# Patient Record
Sex: Male | Born: 1980 | Race: White | Hispanic: No | Marital: Single | Smoking: Current some day smoker
Health system: Southern US, Community
[De-identification: ages and names within clinical notes are randomized; demographics above are authoritative.]

## PROBLEM LIST (undated history)

## (undated) DIAGNOSIS — K219 Gastro-esophageal reflux disease without esophagitis: Secondary | ICD-10-CM

## (undated) DIAGNOSIS — G4730 Sleep apnea, unspecified: Secondary | ICD-10-CM

## (undated) HISTORY — DX: Gastro-esophageal reflux disease without esophagitis: K21.9

## (undated) HISTORY — PX: OTHER SURGICAL HISTORY: SHX169

## (undated) HISTORY — DX: Sleep apnea, unspecified: G47.30

---
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
## 2004-06-27 ENCOUNTER — Emergency Department (HOSPITAL_COMMUNITY): Admission: EM | Admit: 2004-06-27 | Discharge: 2004-06-27 | Payer: Self-pay | Admitting: Emergency Medicine
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
## 2011-08-22 ENCOUNTER — Emergency Department (HOSPITAL_COMMUNITY)
Admission: EM | Admit: 2011-08-22 | Discharge: 2011-08-22 | Disposition: A | Discharge status: Home / Self Care | Payer: Self-pay | Attending: Emergency Medicine | Admitting: Emergency Medicine

## 2011-08-22 ENCOUNTER — Encounter (HOSPITAL_COMMUNITY): Payer: Self-pay | Admitting: Emergency Medicine

## 2011-08-22 DIAGNOSIS — L0291 Cutaneous abscess, unspecified: Secondary | ICD-10-CM

## 2011-08-22 DIAGNOSIS — L02419 Cutaneous abscess of limb, unspecified: Secondary | ICD-10-CM | POA: Insufficient documentation

## 2011-08-22 MED ORDER — TRAMADOL HCL 50 MG PO TABS: 50.0000 mg | ORAL_TABLET | Freq: Four times a day (QID) | ORAL | Status: DC | PRN

## 2011-08-22 MED ORDER — SULFAMETHOXAZOLE-TMP DS 800-160 MG PO TABS
1.0000 | ORAL_TABLET | Freq: Once | ORAL | Status: AC
  Administered 2011-08-22: 1 via ORAL
  Filled 2011-08-22: qty 1

## 2011-08-22 MED ORDER — TRAMADOL HCL 50 MG PO TABS
100.0000 mg | ORAL_TABLET | Freq: Once | ORAL | Status: AC
  Administered 2011-08-22: 100 mg via ORAL
  Filled 2011-08-22: qty 2

## 2011-08-22 MED ORDER — LIDOCAINE HCL (PF) 1 % IJ SOLN
INTRAMUSCULAR | Status: AC
  Administered 2011-08-22: 21:00:00
  Filled 2011-08-22: qty 5

## 2011-08-22 MED ORDER — SULFAMETHOXAZOLE-TRIMETHOPRIM 800-160 MG PO TABS: 1.0000 | ORAL_TABLET | Freq: Two times a day (BID) | ORAL | Status: AC

## 2011-08-22 NOTE — ED Provider Notes (Signed)
History     CSN: [id number]  Arrival date & time 08/22/11  1910   First MD Initiated Contact with Patient 08/22/11 1941      Chief Complaint  Patient presents with  . Abscess    (Consider location/radiation/quality/duration/timing/severity/associated sxs/prior treatment) Patient is a 31 y.o. male presenting with abscess. The history is provided by the patient.  Abscess  This is a new problem. The current episode started less than one week ago. The onset was gradual. The problem occurs continuously. The problem has been gradually worsening. The abscess is present on the right upper leg. The problem is moderate. The abscess is characterized by painfulness, swelling and redness. Pertinent negatives include no fever, no vomiting and no sore throat. Associated symptoms comments: Patient also denies numbness or rash.  States that nothing makes the pain better or worse.Marland Kitchen His past medical history does not include skin abscesses in family. There were no sick contacts. He has received no recent medical care.    History reviewed. No pertinent past medical history.  History reviewed. No pertinent past surgical history.  History reviewed. No pertinent family history.  History  Substance Use Topics  . Smoking status: Never Smoker   . Smokeless tobacco: Not on file  . Alcohol Use: No      Review of Systems  Constitutional: Negative for fever and chills.  HENT: Negative for sore throat.   Cardiovascular: Negative for leg swelling.  Gastrointestinal: Negative for nausea and vomiting.  Musculoskeletal: Negative for joint swelling and arthralgias.  Skin: Positive for color change.       Abscess   Neurological: Negative for weakness and numbness.  Hematological: Negative for adenopathy.  All other systems reviewed and are negative.    Allergies  Review of patient's allergies indicates no known allergies.  Home Medications  No current outpatient prescriptions on file.  BP 131/74   Pulse 98  Temp 98.5 F (36.9 C) (Oral)  Resp 14  Ht 5\' 8"  (1.727 m)  Wt 221 lb (100.245 kg)  BMI 33.60 kg/m2  SpO2 99%  Physical Exam  Nursing note and vitals reviewed. Constitutional: He is oriented to person, place, and time. He appears well-developed and well-nourished. No distress.  HENT:  Head: Normocephalic and atraumatic.  Cardiovascular: Normal rate, regular rhythm and normal heart sounds.   Pulmonary/Chest: Effort normal and breath sounds normal.  Neurological: He is alert and oriented to person, place, and time. He exhibits normal muscle tone. Coordination normal.  Skin: Skin is warm. There is erythema.       Localized area of erythema and induration to the lateral right thigh.  Mild fluctuance also present.  No red streaks.    ED Course  Procedures (including critical care time)  Labs Reviewed - No data to display      MDM   INCISION AND DRAINAGE Performed by: Pauline Aus L. Consent: Verbal consent obtained. Risks and benefits: risks, benefits and alternatives were discussed Type: abscess  Body area: right thigh Anesthesia: local infiltration  Local anesthetic: lidocaine 1% w/o epinephrine  Anesthetic total: 3 ml  Complexity: complex Blunt dissection to break up loculations  Drainage: purulent  Drainage amount: moderate Packing material: 1/4 in iodoform gauze  Patient tolerance: Patient tolerated the procedure well with no immediate complications.     Patient has abscess to right lateral thigh with 7 cm of surrounding erythema.  Pt is well appearing.  Vitals stable.  Agrees to return here in 2 days for recheck and packing removal.  Patient requests NOT to have narcotics   The patient appears reasonably screened and/or stabilized for discharge and I doubt any other medical condition or other [facility] requiring further screening, evaluation, or treatment in the ED at this time prior to discharge.   Prescribed: Ultram Bactrim DS    Artem Bunte L.  Adreyan Carbajal, [place] 08/24/11 2357

## 2011-08-22 NOTE — ED Notes (Signed)
Pt presents to ED with abscess on right thigh. Pt states he first noticed abscess 3 days ago. Redness noted around site.

## 2011-08-22 NOTE — ED Notes (Signed)
Patient complaining of abscess/possible spider bite to upper right leg. Complaining of swelling, redness, and pressure. States has gotten worse today.

## 2011-08-24 ENCOUNTER — Emergency Department (HOSPITAL_COMMUNITY)
Admission: EM | Admit: 2011-08-24 | Discharge: 2011-08-24 | Disposition: A | Discharge status: Home / Self Care | Payer: Self-pay | Attending: Emergency Medicine | Admitting: Emergency Medicine

## 2011-08-24 ENCOUNTER — Encounter (HOSPITAL_COMMUNITY): Payer: Self-pay | Admitting: *Deleted

## 2011-08-24 DIAGNOSIS — Z5189 Encounter for other specified aftercare: Secondary | ICD-10-CM

## 2011-08-24 DIAGNOSIS — L02419 Cutaneous abscess of limb, unspecified: Secondary | ICD-10-CM | POA: Insufficient documentation

## 2011-08-24 MED ORDER — OXYCODONE-ACETAMINOPHEN 5-325 MG PO TABS: 1.0000 | ORAL_TABLET | Freq: Four times a day (QID) | ORAL | Status: AC | PRN

## 2011-08-24 MED ORDER — OXYCODONE-ACETAMINOPHEN 5-325 MG PREPACK: 1.0000 | ORAL_TABLET | Freq: Four times a day (QID) | ORAL | Status: DC | PRN

## 2011-08-24 NOTE — ED Notes (Signed)
Here for recheck of abscess to rt thigh

## 2011-08-24 NOTE — ED Provider Notes (Signed)
History     CSN: [id number]  Arrival date & time 08/24/11  1909   First MD Initiated Contact with Patient 08/24/11 2116      Chief Complaint  Patient presents with  . Wound Check    (Consider location/radiation/quality/duration/timing/severity/associated sxs/prior treatment) HPI Pt reports he had abscess on R thigh drained 2 days ago. Swelling and redness has improved, but pain has been more severe than he anticipated when he refused pain medications during this last visit. No fevers.   History reviewed. No pertinent past medical history.  History reviewed. No pertinent past surgical history.  History reviewed. No pertinent family history.  History  Substance Use Topics  . Smoking status: Never Smoker   . Smokeless tobacco: Not on file  . Alcohol Use: No      Review of Systems No fever No drainage  Allergies  Review of patient's allergies indicates no known allergies.  Home Medications   Current Outpatient Rx  Name Route Sig Dispense Refill  . ACETAMINOPHEN 500 MG PO TABS Oral Take 1,000 mg by mouth 2 (two) times daily as needed.    . SULFAMETHOXAZOLE-TRIMETHOPRIM 800-160 MG PO TABS Oral Take 1 tablet by mouth 2 (two) times daily. 20 tablet 0    BP 146/61  Pulse 83  Temp 98.2 F (36.8 C) (Oral)  Resp 20  Ht 5\' 7"  (1.702 m)  Wt 221 lb (100.245 kg)  BMI 34.61 kg/m2  SpO2 98%  Physical Exam  Constitutional: He is oriented to person, place, and time. He appears well-developed and well-nourished.  HENT:  Head: Normocephalic and atraumatic.  Neck: Neck supple.  Pulmonary/Chest: Effort normal.  Neurological: He is alert and oriented to person, place, and time. No cranial nerve deficit.  Skin:       Well healing abscess to R thigh  Psychiatric: He has a normal mood and affect. His behavior is normal.    ED Course  Procedures (including critical care time)  Labs Reviewed - No data to display No results found.   No diagnosis found.    MDM    Healing Abscess, s/p I&D without signs of worsening infection.         Charles B. Bernette Mayers, MD 08/24/11 2121

## 2011-08-25 NOTE — ED Provider Notes (Signed)
Medical screening examination/treatment/procedure(s) were performed by non-physician practitioner and as supervising physician I was immediately available for consultation/collaboration.   Glynn Octave, MD 08/25/11 [phone]

## 2011-08-29 MED FILL — Oxycodone w/ Acetaminophen Tab 5-325 MG: ORAL | Qty: 6 | Status: AC

## 2011-10-13 ENCOUNTER — Encounter (HOSPITAL_COMMUNITY): Payer: Self-pay | Admitting: *Deleted

## 2011-10-13 ENCOUNTER — Emergency Department (HOSPITAL_COMMUNITY): Payer: Self-pay

## 2011-10-13 ENCOUNTER — Emergency Department (HOSPITAL_COMMUNITY)
Admission: EM | Admit: 2011-10-13 | Discharge: 2011-10-13 | Disposition: A | Discharge status: Home / Self Care | Payer: Self-pay | Attending: Emergency Medicine | Admitting: Emergency Medicine

## 2011-10-13 DIAGNOSIS — F17200 Nicotine dependence, unspecified, uncomplicated: Secondary | ICD-10-CM | POA: Insufficient documentation

## 2011-10-13 DIAGNOSIS — R0789 Other chest pain: Secondary | ICD-10-CM

## 2011-10-13 DIAGNOSIS — X58XXXA Exposure to other specified factors, initial encounter: Secondary | ICD-10-CM | POA: Insufficient documentation

## 2011-10-13 DIAGNOSIS — R079 Chest pain, unspecified: Secondary | ICD-10-CM | POA: Insufficient documentation

## 2011-10-13 DIAGNOSIS — IMO0002 Reserved for concepts with insufficient information to code with codable children: Secondary | ICD-10-CM | POA: Insufficient documentation

## 2011-10-13 DIAGNOSIS — N289 Disorder of kidney and ureter, unspecified: Secondary | ICD-10-CM

## 2011-10-13 LAB — POCT I-STAT, CHEM 8
Chloride: 106 mEq/L (ref 96–112)
Creatinine, Ser: 1.4 mg/dL — ABNORMAL HIGH (ref 0.50–1.35)
Glucose, Bld: 99 mg/dL (ref 70–99)
Potassium: 3.8 mEq/L (ref 3.5–5.1)

## 2011-10-13 MED ORDER — IBUPROFEN 800 MG PO TABS
800.0000 mg | ORAL_TABLET | Freq: Once | ORAL | Status: AC
  Administered 2011-10-13: 800 mg via ORAL
  Filled 2011-10-13: qty 1

## 2011-10-13 NOTE — ED Notes (Signed)
Pt alert & oriented x4, stable gait. Patient given discharge instructions, paperwork. Patient instructed to stop at the registration desk to finish any additional paperwork. Patient verbalized understanding. Pt left department w/ no further questions.  

## 2011-10-13 NOTE — ED Notes (Signed)
Chest pain last pm with pain in both arms,  Numb sensation. In arms and legs.intermittently.

## 2011-10-13 NOTE — ED Notes (Signed)
Pt reports chest pain the middle & right side. Denies any new activity or injury. Denies any N/V.

## 2011-10-13 NOTE — ED Provider Notes (Signed)
History     CSN: [id number]  Arrival date & time 10/13/11  4098   First MD Initiated Contact with Patient 10/13/11 1938      Chief Complaint  Patient presents with  . Chest Pain    (Consider location/radiation/quality/duration/timing/severity/associated sxs/prior treatment) HPI Patient with some tingling to right arm during night last night.  Today had intermittent right chest tightness all day which worsened with right arm movement.  He denies cough, congestion, fever, chills, leg swelling, history of pe, exertional pain, trauma, or numbness.  He has no family history of cad.  History reviewed. No pertinent past medical history.  History reviewed. No pertinent past surgical history.  History reviewed. No pertinent family history.  History  Substance Use Topics  . Smoking status: Current Some Day Smoker  . Smokeless tobacco: Not on file  . Alcohol Use: No      Review of Systems  Constitutional: Negative for fever and chills.  HENT: Negative for neck stiffness.   Eyes: Negative for visual disturbance.  Respiratory: Negative for shortness of breath.   Cardiovascular: Positive for chest pain. Negative for leg swelling.  Gastrointestinal: Negative for vomiting, diarrhea and blood in stool.  Genitourinary: Negative for dysuria, frequency and decreased urine volume.  Musculoskeletal: Negative for myalgias and joint swelling.  Skin: Negative for rash.  Neurological: Negative for weakness.  Hematological: Negative for adenopathy.  Psychiatric/Behavioral: Negative for agitation.    Allergies  Review of patient's allergies indicates no known allergies.  Home Medications   Current Outpatient Rx  Name Route Sig Dispense Refill  . ACETAMINOPHEN 500 MG PO TABS Oral Take 1,000 mg by mouth 2 (two) times daily as needed.      There were no vitals taken for this visit.  Physical Exam  Vitals reviewed. Constitutional: He is oriented to person, place, and time. He appears  well-developed and well-nourished.  HENT:  Head: Normocephalic and atraumatic.  Right Ear: External ear normal.  Left Ear: External ear normal.  Nose: Nose normal.  Mouth/Throat: Oropharynx is clear and moist.  Eyes: Conjunctivae normal and EOM are normal. Pupils are equal, round, and reactive to light.  Neck: Normal range of motion. Neck supple.  Cardiovascular: Normal rate, regular rhythm, normal heart sounds and intact distal pulses.   Pulmonary/Chest: Effort normal and breath sounds normal. No respiratory distress. He has no wheezes. He exhibits no tenderness.       Chest ttp over right costochondral junction reproducing pain.   Abdominal: Soft. Bowel sounds are normal. He exhibits no distension and no mass. There is no tenderness. There is no guarding.  Musculoskeletal: Normal range of motion.       Abrasion palm left hand  Neurological: He is alert and oriented to person, place, and time. He has normal reflexes. He exhibits normal muscle tone. Coordination normal.  Skin: Skin is warm and dry.  Psychiatric: He has a normal mood and affect. His behavior is normal. Judgment and thought content normal.    ED Course  Procedures (including critical care time)  Labs Reviewed - No data to display No results found.   No diagnosis found.   Date: 10/13/2011  Rate: 86  Rhythm: normal sinus rhythm  QRS Axis: normal  Intervals: normal  ST/T Wave abnormalities: normal  Conduction Disutrbances: none  Narrative Interpretation: unremarkable      MDM      Chest wall pain- normal ekg, chest pain reproducible, cxr clear.  Patient advised  Renal insufficiency.  Creatinine here 1.4.  Patient advised not to use further nsaids and to use tylenol and recheck next week.     Hilario Quarry, MD 10/13/11 2049

## 2012-12-02 IMAGING — CR DG CHEST 2V
2 series · 2 of 2 positions shown · non-contrast
Comparison: None.

CLINICAL DATA: Chest pain.

CHEST - 2 VIEW

[view not recorded (1 of 2)]
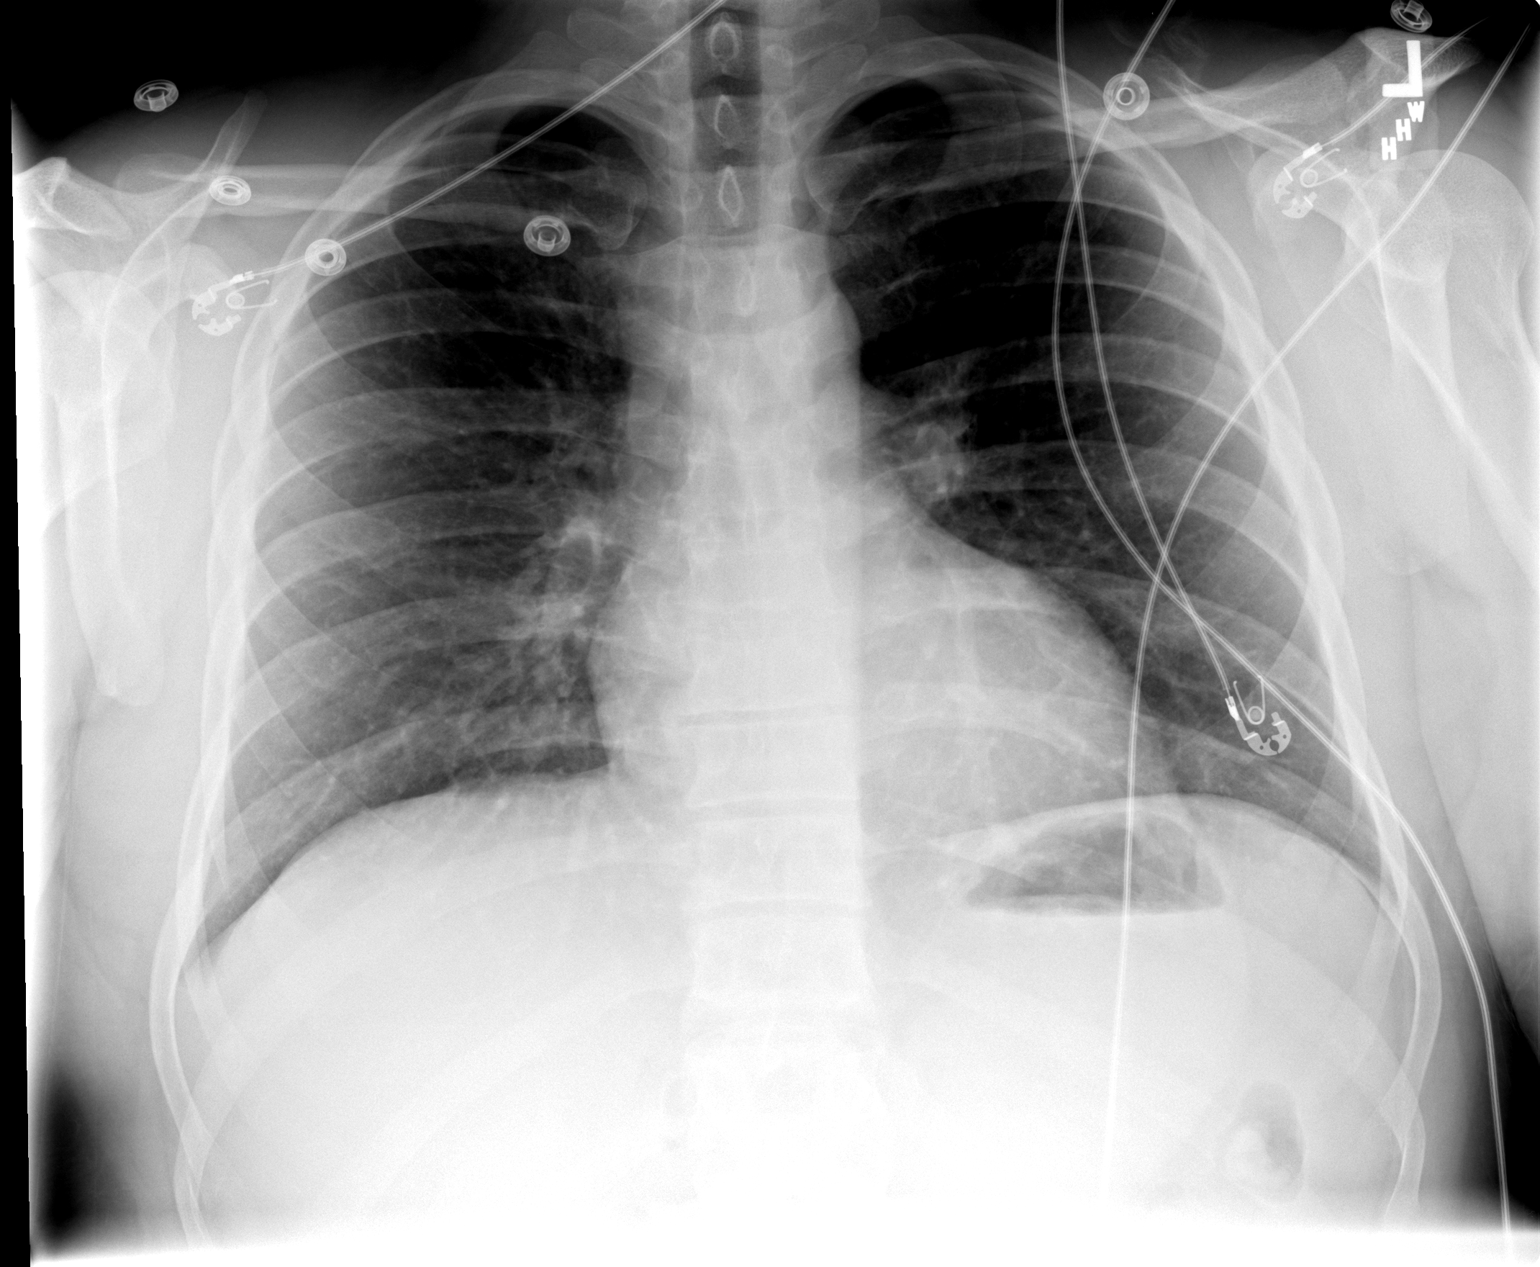

[view not recorded (2 of 2)]
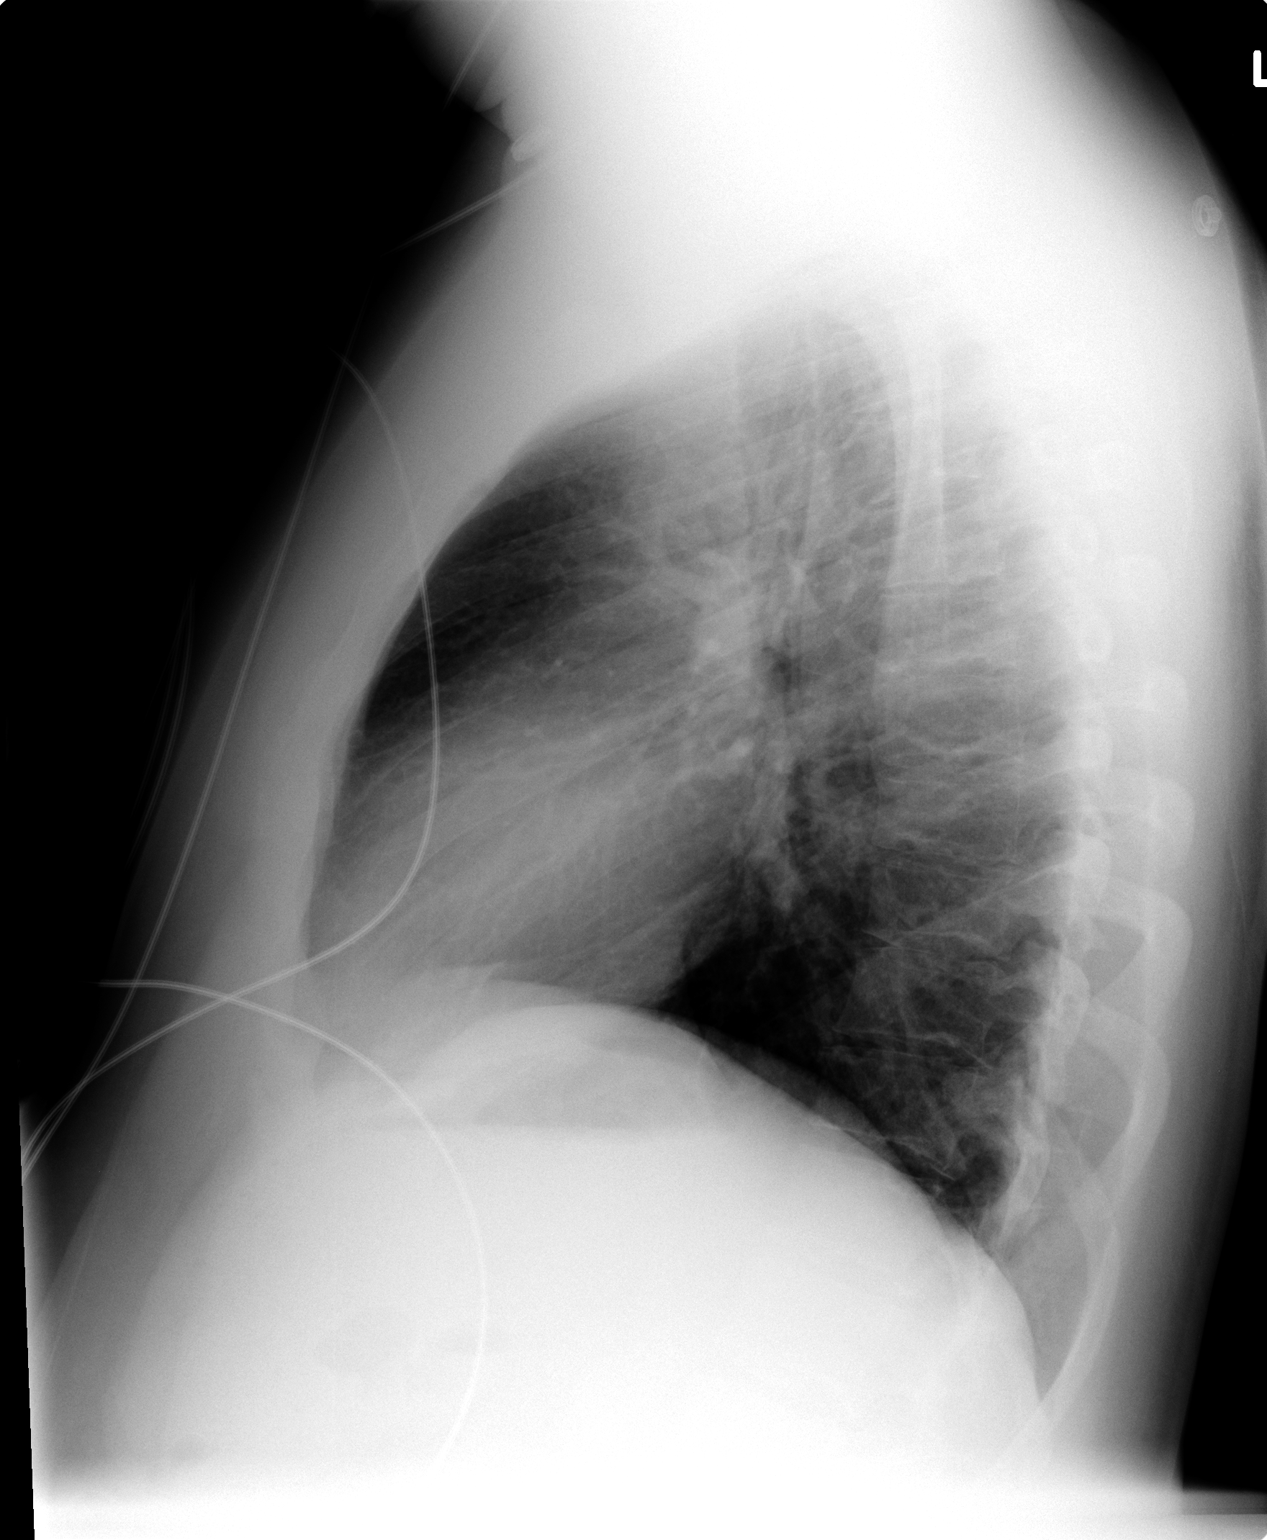

[2 of 2 positions shown; findings below may reference images not displayed]

FINDINGS: Trachea is midline.  Heart size normal.  Lungs are clear.
No pleural fluid.
IMPRESSION: No acute findings.

## 2017-08-27 ENCOUNTER — Encounter: Payer: Self-pay | Admitting: Internal Medicine

## 2017-09-14 ENCOUNTER — Telehealth: Payer: Self-pay | Admitting: Internal Medicine

## 2017-09-14 NOTE — Telephone Encounter (Signed)
Called and spoke with Roxine CaddyAmy Boyd, PA. [facility]. Checked H.pylori serology and positive per Roxine CaddyAmy Boyd, PA, tried treatment but not tolerated and changed regimens. (I am not entirely clear the whole regimen), regardless, this has been completed.   Yesterday with recurrent epigastric pain, burning, vomiting. Had episode of hematemesis. Still with a lot of abdominal pain. Diffuse tenderness but no rebound or guarding. Continues with Carafate, Protonix BID. In office currently.   I had recommended if severe abdominal pain to present to ED. In interim, would recommend checking stat CT abd/pelvis today, repeat labs. Recommended he go to ED if worsening pain over weekend. Will likely need EGD vs other route if CT dictates otherwise. Need to rule out complicated process, which [facility] is taking care of today.   Opening on 9/5 at 9am. Roxine CaddyAmy Boyd, PA informed and will tell patient.    Darl PikesSusan: can we have all records [facility] has (from [facility], labs, etc), sent to us so this can be available for appt?

## 2017-09-14 NOTE — Telephone Encounter (Signed)
Requested records.

## 2017-09-20 ENCOUNTER — Encounter

## 2017-09-20 ENCOUNTER — Ambulatory Visit: Payer: Self-pay | Admitting: Gastroenterology

## 2017-10-01 ENCOUNTER — Telehealth: Payer: Self-pay

## 2017-10-01 ENCOUNTER — Encounter: Payer: Self-pay | Admitting: Nurse Practitioner

## 2017-10-01 ENCOUNTER — Ambulatory Visit: Payer: Medicaid Other | Admitting: Nurse Practitioner

## 2017-10-01 ENCOUNTER — Other Ambulatory Visit: Payer: Self-pay

## 2017-10-01 DIAGNOSIS — K219 Gastro-esophageal reflux disease without esophagitis: Secondary | ICD-10-CM

## 2017-10-01 DIAGNOSIS — R109 Unspecified abdominal pain: Secondary | ICD-10-CM | POA: Insufficient documentation

## 2017-10-01 DIAGNOSIS — R112 Nausea with vomiting, unspecified: Secondary | ICD-10-CM

## 2017-10-01 DIAGNOSIS — R768 Other specified abnormal immunological findings in serum: Secondary | ICD-10-CM | POA: Diagnosis not present

## 2017-10-01 DIAGNOSIS — R1013 Epigastric pain: Secondary | ICD-10-CM | POA: Diagnosis not present

## 2017-10-01 MED ORDER — SUCRALFATE 1 GM/10ML PO SUSP: 1.0000 g | Freq: Four times a day (QID) | ORAL | 2 refills | Status: AC | PRN

## 2017-10-01 NOTE — Assessment & Plan Note (Signed)
The patient had a positive H. pylori serologies in the emergency department.  Unclear if this is representing an acute infection or a previously resolved infection.  He was treated with appropriate 4-agent regimen which she completed.  He will need testing for eradication.  This will likely be done by endoscopy based on recommendations above.  If he is not eradicated and then he will need further treatment.  Follow-up in 3 months.

## 2017-10-01 NOTE — Patient Instructions (Addendum)
1. Stop taking Protonix for now. 2. Start taking Dexilant 60 mg.  I am providing samples for 1 to 2 weeks.  Call us in 1 to 2 weeks and let us know if it is helping. 3. Start taking Carafate up to 4 times a day, as needed for breakthrough heartburn/stomach pain. 4. We will schedule your upper endoscopy for you. 5. Further recommendations will be made after colonoscopy. 6. Have your blood test drawn when you are able to. 7. Return for follow-up in 3 months. 8. Call us if you have any questions or concerns.  At [facility] we value your feedback. You may receive a survey about your visit today. Please share your experience as we strive to create trusting relationships with our patients to provide genuine, compassionate, quality care.  We appreciate your understanding and patience as we review any laboratory studies, imaging, and other diagnostic tests that are ordered as we care for you. Our office policy is 5 business days for review of these results, and any emergent or urgent results are addressed in a timely manner for your best interest. If you do not hear from our office in 1 week, please contact us.   We also encourage the use of MyChart, which contains your medical information for your review as well. If you are not enrolled in this feature, an access code is on this after visit summary for your convenience. Thank you for allowing [place] to be involved in your care.  It was great to meet you today!  I hope you have a great summer!!     Food Choices for Gastroesophageal Reflux Disease, Adult When you have gastroesophageal reflux disease (GERD), the foods you eat and your eating habits are very important. Choosing the right foods can help ease the discomfort of GERD. Consider working with a diet and nutrition specialist (dietitian) to help you make healthy food choices. What general guidelines should I follow? Eating plan  Choose healthy foods low in fat, such as fruits,  vegetables, whole grains, low-fat dairy products, and lean meat, fish, and poultry.  Eat frequent, small meals instead of three large meals each day. Eat your meals slowly, in a relaxed setting. Avoid bending over or lying down until 2-3 hours after eating.  Limit high-fat foods such as fatty meats or fried foods.  Limit your intake of oils, butter, and shortening to less than 8 teaspoons each day.  Avoid the following: ? Foods that cause symptoms. These may be different for different people. Keep a food diary to keep track of foods that cause symptoms. ? Alcohol. ? Drinking large amounts of liquid with meals. ? Eating meals during the 2-3 hours before bed.  Cook foods using methods other than frying. This may include baking, grilling, or broiling. Lifestyle   Maintain a healthy weight. Ask your health care provider what weight is healthy for you. If you need to lose weight, work with your health care provider to do so safely.  Exercise for at least 30 minutes on 5 or more days each week, or as told by your health care provider.  Avoid wearing clothes that fit tightly around your waist and chest.  Do not use any products that contain nicotine or tobacco, such as cigarettes and e-cigarettes. If you need help quitting, ask your health care provider.  Sleep with the head of your bed raised. Use a wedge under the mattress or blocks under the bed frame to raise the head of the bed. What  foods are not recommended? The items listed may not be a complete list. Talk with your dietitian about what dietary choices are best for you. Grains Pastries or quick breads with added fat. [place] toast. Vegetables Deep fried vegetables. [place] fries. Any vegetables prepared with added fat. Any vegetables that cause symptoms. For some people this may include tomatoes and tomato products, chili peppers, onions and garlic, and horseradish. Fruits Any fruits prepared with added fat. Any fruits that cause  symptoms. For some people this may include citrus fruits, such as oranges, grapefruit, pineapple, and lemons. Meats and other protein foods High-fat meats, such as fatty beef or pork, hot dogs, ribs, ham, sausage, salami and bacon. Fried meat or protein, including fried fish and fried chicken. Nuts and nut butters. Dairy Whole milk and chocolate milk. Sour cream. Cream. Ice cream. Cream cheese. Milk shakes. Beverages Coffee and tea, with or without caffeine. Carbonated beverages. Sodas. Energy drinks. Fruit juice made with acidic fruits (such as orange or grapefruit). Tomato juice. Alcoholic drinks. Fats and oils Butter. Margarine. Shortening. Ghee. Sweets and desserts Chocolate and cocoa. Donuts. Seasoning and other foods Pepper. Peppermint and spearmint. Any condiments, herbs, or seasonings that cause symptoms. For some people, this may include curry, hot sauce, or vinegar-based salad dressings. Summary  When you have gastroesophageal reflux disease (GERD), food and lifestyle choices are very important to help ease the discomfort of GERD.  Eat frequent, small meals instead of three large meals each day. Eat your meals slowly, in a relaxed setting. Avoid bending over or lying down until 2-3 hours after eating.  Limit high-fat foods such as fatty meat or fried foods. This information is not intended to replace advice given to you by your health care provider. Make sure you discuss any questions you have with your health care provider. Document Released: 01/02/2005 Document Revised: 01/04/2016 Document Reviewed: 01/04/2016 Elsevier Interactive Patient Education  [employer]2018 [employer]

## 2017-10-01 NOTE — Assessment & Plan Note (Signed)
Acute onset abdominal pain which is not been relieved by H. pylori treatment and twice daily Protonix.  His pain is constant, epigastric, burning and associated with nausea/vomiting.  I will change him to Dexilant daily and Carafate 4 times a day.  EGD as per above.  He did have a CT scan completed by his primary care.  We will request these results.  I will check CBC today due to complaint of hematemesis intermittently for any change in hemoglobin.  Follow-up in 3 months.

## 2017-10-01 NOTE — Progress Notes (Signed)
Primary Care Physician:  Lovey NewcomerBoyd, William S, PA Primary Gastroenterologist:  Dr. Jena Gaussourk  Chief Complaint  Patient presents with  . Abdominal Pain    pain comes/goes  . h pylori  . Nausea    w/ vomiting  . Diarrhea    occas    HPI:   Kent Collins is a 37 y.o. male who presents on referral from primary care for abdominal pain, GERD, H. pylori positive serologies.  Appears onset 09/14/2017 a phone consult was provided (see note for further details).  Patient is a positive for H. pylori on serology at [facility].  Completed some form of treatment.  Recurrent issues including epigastric pain, burning, vomiting with episode of hematemesis and a lot of diffuse abdominal pain.  Still on Carafate and Protonix twice daily.  Recommended presentation to the ED if severe symptoms or unresolving symptoms.  In the interim, stat CT of the abdomen and pelvis and repeat labs.  Indicated would likely need EGD depending on CT findings.  Patient was initially scheduled for 09/20/2017 but this was canceled and he was scheduled for this morning.  Cards are requested.  Mention provided with the referral including office visit note dated 08/23/2017 which was in the emergency department follow-up for abdominal pain, heartburn, vomiting.  He was found to be H. pylori positive in the emergency department and was treated with Amoxil, Biaxin, Protonix.  Continued heartburn and abdominal discomfort although minimally improved, difficulty eating, lots of heartburn and reflux with meals and at night.  GERD and abdominal pain symptoms were acute onset.  He was provided with Carafate.  He was having difficulty tolerating his Biaxin regimen and was started on tetracycline, Flagyl, Pepto-Bismol, PPI as a substitute regimen.  Referred to GI.  The labs provided which were drawn 08/20/2017 presumably in the emergency department which found elevated white cell count at 13.7, otherwise essentially normal.  CMP also essentially  normal.  His H. pylori testing was serology and found to be positive for antigen.  Abdominal/pelvic CT results that were recommended is not available to me at this time.  He is accompanied by his fiancee today. Today he states he did have [facility] 2 weeks ago, hasn't received results. He completed his second regimen about 1 week ago. He is still on Protonix twice a day. He has chronic history of GERD and has done well on Zantac most of his life. His new/acute onset abdominal pain was not relieved with Zantac. He is still having abdominal pain, no better. Abdominal pain is constant, burning, epigastric and radiates downward. No worse after eating. "Sometimes I can eat something that will irritate it." has lost 18 lbs in 2 weeks with cutting out sweets, bread, sodas, sweet tea. Has taken Ibuprofen recently which caused a flare of his symptoms. Denies hematochezia, melena. Is nauseated all the time, last emesis last night. Ty[pically vomits 4 times a week. Had hematemesis last night "more than a trace" and described as red blood, no coffee grounds. Does have typical GERD triggers (spicy foods, etc). Denies fever, chills. Denies chest pain, dyspnea, dizziness, lightheadedness, syncope, near syncope. Denies any other upper or lower GI symptoms.  Past Medical History:  Diagnosis Date  . GERD (gastroesophageal reflux disease)   . Sleep apnea    awaiting on CPAP as of 10/01/17    Past Surgical History:  Procedure Laterality Date  . NONE TO DATE     As of 10/01/17    Current  Outpatient Medications  Medication Sig Dispense Refill  . acetaminophen (TYLENOL) 500 MG tablet Take 1,000 mg by mouth every 6 (six) hours as needed.     . pantoprazole (PROTONIX) 40 MG tablet Take 1 tablet by mouth 2 (two) times daily.  1   No current facility-administered medications for this visit.     Allergies as of 10/01/2017  . (No Known Allergies)    Family History  Problem Relation Age of  Onset  . Ulcers Mother   . Ulcers Maternal Uncle   . Colon cancer Neg Hx   . Gastric cancer Neg Hx   . Esophageal cancer Neg Hx     Social History   Socioeconomic History  . Marital status: Single    Spouse name: Not on file  . Number of children: Not on file  . Years of education: Not on file  . Highest education level: Not on file  Occupational History  . Not on file  Social Needs  . Financial resource strain: Not on file  . Food insecurity:    Worry: Not on file    Inability: Not on file  . Transportation needs:    Medical: Not on file    Non-medical: Not on file  Tobacco Use  . Smoking status: Current Some Day Smoker    Types: Cigarettes  . Smokeless tobacco: Never Used  . Tobacco comment: Typically smokes 5 cigarettes a week.  Substance and Sexual Activity  . Alcohol use: No  . Drug use: No  . Sexual activity: Not on file  Lifestyle  . Physical activity:    Days per week: Not on file    Minutes per session: Not on file  . Stress: Not on file  Relationships  . Social connections:    Talks on phone: Not on file    Gets together: Not on file    Attends religious service: Not on file    Active member of club or organization: Not on file    Attends meetings of clubs or organizations: Not on file    Relationship status: Not on file  . Intimate partner violence:    Fear of current or ex partner: Not on file    Emotionally abused: Not on file    Physically abused: Not on file    Forced sexual activity: Not on file  Other Topics Concern  . Not on file  Social History Narrative  . Not on file    Review of Systems: General: Negative for anorexia, weight loss, fever, chills, fatigue, weakness. ENT: Negative for hoarseness, difficulty swallowing. CV: Negative for chest pain, angina, palpitations, peripheral edema.  Respiratory: Negative for dyspnea at rest, cough, sputum, wheezing.  GI: See history of present illness. MS: Negative for joint pain, low back  pain.  Derm: Negative for rash or itching.  Endo: Negative for unusual weight change.  Heme: Negative for bruising or bleeding. Allergy: Negative for rash or hives.    Physical Exam: BP 136/79   Pulse 71   Temp (!) 97 F (36.1 C) (Oral)   Ht 5\' 6"  (1.676 m)   Wt (!) 355 lb (161 kg)   BMI 57.30 kg/m  General:   Morbidly obese male. Alert and oriented. Pleasant and cooperative. Well-nourished and well-developed.  Head:  Normocephalic and atraumatic. Eyes:  Without icterus, sclera clear and conjunctiva pink.  Ears:  Normal auditory acuity. Cardiovascular:  S1, S2 present without murmurs appreciated. Extremities without clubbing or edema. Respiratory:  Clear to auscultation  bilaterally. No wheezes, rales, or rhonchi. No distress.  Gastrointestinal:  +BS, obese, soft, and non-distended. Moderate TTP epigastric region. No HSM noted. No guarding or rebound. No masses appreciated.  Rectal:  Deferred  Musculoskalatal:  Symmetrical without gross deformities. Neurologic:  Alert and oriented x4;  grossly normal neurologically. Psych:  Alert and cooperative. Normal mood and affect. Heme/Lymph/Immune: No excessive bruising noted.    10/01/2017 10:39 AM   Disclaimer: This note was dictated with voice recognition software. Similar sounding words can inadvertently be transcribed and may not be corrected upon review.

## 2017-10-01 NOTE — Assessment & Plan Note (Signed)
Patient notes a chronic history of GERD which is typically responded well to Zantac.  When he started having his acute onset abdominal pain Zantac did not help.  He was seen in the emergency department where he tested positive for H. pylori and was treated with a 4 agent regimen, which he completed.  Abdominal pain persists.  It is constant, burning, nausea.  He does have some hematemesis with vomiting.  I will check a CBC for acute blood loss, although I doubt this.  I will change his Protonix to Dexilant and provide samples for 1 to 2 weeks and request a progress report in 1 to 2 weeks.  I will start him on Carafate 4 times daily as well.  This is for symptomatic management until we can accomplish an upper endoscopy.  Further recommendations to follow.  Follow-up in 3 months in our office.  Proceed with EGD on propofol/MAC with Dr. Gala Romney in near future: the risks, benefits, and alternatives have been discussed with the patient in detail. The patient states understanding and desires to proceed.  The patient is not on any anticoagulants, anxiolytics, chronic pain medications, or antidepressants.  He is morbidly obese.  We will plan for the procedure on propofol/MAC to promote adequate sedation.

## 2017-10-01 NOTE — Progress Notes (Signed)
cc'd to pcp 

## 2017-10-01 NOTE — Assessment & Plan Note (Signed)
The patient does have nausea and vomiting about 4 times a week.  He does occasionally have hematemesis described as red blood, no coffee grounds.  Last episode of hematemesis was last night and "not a lot, but more than just trace."  We will change his PPI and add Carafate for symptomatic management.  Upper endoscopy will be accomplished for further evaluation.  CT results will be requested.  Labs as per below.  Follow-up in 3 months.

## 2017-10-01 NOTE — Telephone Encounter (Signed)
Called and informed pt of pre-op appt 10/24/17 at 12:45pm. Letter mailed.

## 2017-10-17 ENCOUNTER — Telehealth: Payer: Self-pay | Admitting: Gastroenterology

## 2017-10-17 DIAGNOSIS — K219 Gastro-esophageal reflux disease without esophagitis: Secondary | ICD-10-CM

## 2017-10-17 DIAGNOSIS — R1013 Epigastric pain: Secondary | ICD-10-CM

## 2017-10-17 DIAGNOSIS — R112 Nausea with vomiting, unspecified: Secondary | ICD-10-CM

## 2017-10-17 MED ORDER — DEXLANSOPRAZOLE 60 MG PO CPDR: 60.0000 mg | DELAYED_RELEASE_CAPSULE | Freq: Every day | ORAL | 3 refills | Status: DC

## 2017-10-17 NOTE — Addendum Note (Signed)
Addended by: Delane Ginger, Dayshawn Irizarry A on: 10/17/2017 04:57 PM   Modules accepted: Orders

## 2017-10-17 NOTE — Telephone Encounter (Signed)
Forwarding to [employer], NP, who gave pt samples of Dexilant on 10/01/2017. Please send in Rx.

## 2017-10-17 NOTE — Telephone Encounter (Signed)
Please notify patient, Rx sent per request.

## 2017-10-17 NOTE — Telephone Encounter (Signed)
Pt said he was given samples and doesn't remember the name of them. He said they worked and needs a prescription called into [facility].

## 2017-10-18 NOTE — Telephone Encounter (Signed)
Tried to call and VM not set up.  

## 2017-10-19 NOTE — Telephone Encounter (Signed)
Tried to call and VM not set up.  

## 2017-10-22 NOTE — Patient Instructions (Signed)
20    Your procedure is scheduled on:10/29/2017   Report to [facility] at   700  AM.  Call this number if you have problems the morning of surgery: [phone]   Remember:   Do not drink or eat food:After Midnight.    Clear liquids include soda, tea, black coffee, apple or grape juice, broth.  Take these medicines the morning of surgery with A SIP OF WATER: Dexilant    Do not wear jewelry, make-up or nail polish.  Do not wear lotions, powders, or perfumes. You may wear deodorant.  Do not shave 48 hours prior to surgery. Men may shave face and neck.  Do not bring valuables to the hospital.  Contacts, dentures or bridgework may not be worn into surgery.  Leave suitcase in the car. After surgery it may be brought to your room.  For patients admitted to the hospital, checkout time is 11:00 AM the day of discharge.   Patients discharged the day of surgery will not be allowed to drive home.  Name and phone number of your driver:    Please read over the following fact sheets that you were given: Pain Booklet, Lab Information and Anesthesia Post-op Instructions   Endoscopy Care After Please read the instructions outlined below and refer to this sheet in the next few weeks. These discharge instructions provide you with general information on caring for yourself after you leave the hospital. Your doctor may also give you specific instructions. While your treatment has been planned according to the most current medical practices available, unavoidable complications occasionally occur. If you have any problems or questions after discharge, please call your doctor. HOME CARE INSTRUCTIONS Activity  You may resume your regular activity but move at a slower pace for the next 24 hours.   Take frequent rest periods for the next 24 hours.   Walking will help expel (get rid of) the air and reduce the bloated feeling in your abdomen.   No driving for 24 hours (because of the anesthesia (medicine) used  during the test).   You may shower.   Do not sign any important legal documents or operate any machinery for 24 hours (because of the anesthesia used during the test).  Nutrition  Drink plenty of fluids.   You may resume your normal diet.   Begin with a light meal and progress to your normal diet.   Avoid alcoholic beverages for 24 hours or as instructed by your caregiver.  Medications You may resume your normal medications unless your caregiver tells you otherwise. What you can expect today  You may experience abdominal discomfort such as a feeling of fullness or "gas" pains.   You may experience a sore throat for 2 to 3 days. This is normal. Gargling with salt water may help this.  Follow-up Your doctor will discuss the results of your test with you. SEEK IMMEDIATE MEDICAL CARE IF:  You have excessive nausea (feeling sick to your stomach) and/or vomiting.   You have severe abdominal pain and distention (swelling).   You have trouble swallowing.   You have a temperature over 100 F (37.8 C).   You have rectal bleeding or vomiting of blood.  Document Released: 08/17/2003 Document Revised: 12/22/2010 Document Reviewed: 02/27/2007

## 2017-10-24 ENCOUNTER — Telehealth: Payer: Self-pay

## 2017-10-24 ENCOUNTER — Encounter (HOSPITAL_COMMUNITY)
Admission: RE | Admit: 2017-10-24 | Discharge: 2017-10-24 | Disposition: A | Discharge status: Home / Self Care | Payer: Medicaid Other | Source: Ambulatory Visit | Attending: Internal Medicine | Admitting: Internal Medicine

## 2017-10-24 ENCOUNTER — Encounter (HOSPITAL_COMMUNITY): Payer: Self-pay

## 2017-10-24 NOTE — Telephone Encounter (Signed)
Received VM for Kent Collins at [facility] endo, pt was no show for pre-op appt. They tried to call pt but no VM was set-up. I tried to call pt, spoke to [place]. Gave her phone number for endo scheduler. Advised her he would need to have pre-op appt prior to having procedure.

## 2017-10-26 NOTE — Telephone Encounter (Signed)
Spoke to endo scheduler, pt hasn't called to reschedule pre-op appt. EGD for 10/29/17 will be cancelled since he had no pre-op appt. Tried to call pt, no answer, unable to leave VM d/t no VM box is set-up.

## 2017-10-29 ENCOUNTER — Encounter (HOSPITAL_COMMUNITY): Admission: RE | Payer: Self-pay | Source: Ambulatory Visit

## 2017-10-29 ENCOUNTER — Ambulatory Visit (HOSPITAL_COMMUNITY): Admission: RE | Admit: 2017-10-29 | Payer: Medicaid Other | Source: Ambulatory Visit | Admitting: Internal Medicine

## 2017-10-29 SURGERY — ESOPHAGOGASTRODUODENOSCOPY (EGD) WITH PROPOFOL
Anesthesia: Monitor Anesthesia Care

## 2017-12-04 ENCOUNTER — Ambulatory Visit: Payer: Self-pay | Admitting: Nurse Practitioner

## 2018-01-01 ENCOUNTER — Ambulatory Visit: Payer: Medicaid Other | Admitting: Nurse Practitioner

## 2018-01-01 ENCOUNTER — Telehealth: Payer: Self-pay | Admitting: Internal Medicine

## 2018-01-01 ENCOUNTER — Encounter: Payer: Self-pay | Admitting: Internal Medicine

## 2018-01-01 NOTE — Telephone Encounter (Signed)
Patient was a no show and letter sent  °

## 2018-01-01 NOTE — Progress Notes (Deleted)
Referring Provider: Lavella Lemons, PA Primary Care Physician:  Lavella Lemons, PA Primary GI:  Dr. Gala Romney  No chief complaint on file.   HPI:   Kent Collins is a 37 y.o. male who presents for follow-up on GERD and abdominal pain.  The patient was last seen in our office 10/01/2017 for GERD, epigastric pain, positive H. pylori serology, nausea and vomiting.  Positive serologies were at [facility] and completed some form of treatment.  At the time she is having epigastric pain, burning, vomiting with episode of hematemesis and a lot of diffuse abdominal pain.  She is still on Carafate and Protonix.  Stat CT query need for EGD depending on findings.  Reviewed ER records and was treated with Amoxil, Biaxin, Protonix.  The patient was having difficulty tolerating Biaxin and was switched to tetracycline, Flagyl, Pepto-Bismol, PPI as a substitute regimen and referred to GI.  At his last office visit he indicated he did not receive his CT results.  Completed his second regimen, still on Protonix twice daily.  Chronic history of GERD done well on Zantac most of his life.  He made life changes related to his diet and has lost 18 pounds in 2 weeks.  Recent ibuprofen caused a flare of his symptoms.  Significant nausea, emesis the night before.  Typically vomits 4 times a week.  Noted hematemesis the night before "more than a trace" described as red blood, no coffee grounds.  Typical GERD figures.  No other GI symptoms.  He was changed to Dexilant and Carafate 4 times a day.  Recommended EGD on propofol/MAC.  We will request CT results.  Recommended CBC given hematemesis.  Follow-up in 3 months.  It does not appear CBC was completed.  The patient was a no-show for preop appointment.  EGD was subsequently canceled.  Today he states   Past Medical History:  Diagnosis Date  . GERD (gastroesophageal reflux disease)   . Sleep apnea    awaiting on CPAP as of 10/01/17    Past Surgical History:    Procedure Laterality Date  . NONE TO DATE     As of 10/01/17    Current Outpatient Medications  Medication Sig Dispense Refill  . acetaminophen (TYLENOL) 500 MG tablet Take 1,000 mg by mouth every 6 (six) hours as needed for moderate pain.     Marland Kitchen dexlansoprazole (DEXILANT) 60 MG capsule Take 1 capsule (60 mg total) by mouth daily. 90 capsule 3  . sucralfate (CARAFATE) 1 GM/10ML suspension Take 10 mLs (1 g total) by mouth 4 (four) times daily as needed. (Patient not taking: Reported on 10/23/2017) 420 mL 2   No current facility-administered medications for this visit.     Allergies as of 01/01/2018  . (No Known Allergies)    Family History  Problem Relation Age of Onset  . Ulcers Mother   . Ulcers Maternal Uncle   . Colon cancer Neg Hx   . Gastric cancer Neg Hx   . Esophageal cancer Neg Hx     Social History   Socioeconomic History  . Marital status: Single    Spouse name: Not on file  . Number of children: Not on file  . Years of education: Not on file  . Highest education level: Not on file  Occupational History  . Not on file  Social Needs  . Financial resource strain: Not on file  . Food insecurity:    Worry: Not on file  Inability: Not on file  . Transportation needs:    Medical: Not on file    Non-medical: Not on file  Tobacco Use  . Smoking status: Current Some Day Smoker    Types: Cigarettes  . Smokeless tobacco: Never Used  . Tobacco comment: Typically smokes 5 cigarettes a week.  Substance and Sexual Activity  . Alcohol use: No  . Drug use: No  . Sexual activity: Not on file  Lifestyle  . Physical activity:    Days per week: Not on file    Minutes per session: Not on file  . Stress: Not on file  Relationships  . Social connections:    Talks on phone: Not on file    Gets together: Not on file    Attends religious service: Not on file    Active member of club or organization: Not on file    Attends meetings of clubs or organizations: Not on  file    Relationship status: Not on file  Other Topics Concern  . Not on file  Social History Narrative  . Not on file    Review of Systems: General: Negative for anorexia, weight loss, fever, chills, fatigue, weakness. Eyes: Negative for vision changes.  ENT: Negative for hoarseness, difficulty swallowing , nasal congestion. CV: Negative for chest pain, angina, palpitations, dyspnea on exertion, peripheral edema.  Respiratory: Negative for dyspnea at rest, dyspnea on exertion, cough, sputum, wheezing.  GI: See history of present illness. GU:  Negative for dysuria, hematuria, urinary incontinence, urinary frequency, nocturnal urination.  MS: Negative for joint pain, low back pain.  Derm: Negative for rash or itching.  Neuro: Negative for weakness, abnormal sensation, seizure, frequent headaches, memory loss, confusion.  Psych: Negative for anxiety, depression, suicidal ideation, hallucinations.  Endo: Negative for unusual weight change.  Heme: Negative for bruising or bleeding. Allergy: Negative for rash or hives.   Physical Exam: There were no vitals taken for this visit. General:   Alert and oriented. Pleasant and cooperative. Well-nourished and well-developed.  Head:  Normocephalic and atraumatic. Eyes:  Without icterus, sclera clear and conjunctiva pink.  Ears:  Normal auditory acuity. Mouth:  No deformity or lesions, oral mucosa pink.  Throat/Neck:  Supple, without mass or thyromegaly. Cardiovascular:  S1, S2 present without murmurs appreciated. Normal pulses noted. Extremities without clubbing or edema. Respiratory:  Clear to auscultation bilaterally. No wheezes, rales, or rhonchi. No distress.  Gastrointestinal:  +BS, soft, non-tender and non-distended. No HSM noted. No guarding or rebound. No masses appreciated.  Rectal:  Deferred  Musculoskalatal:  Symmetrical without gross deformities. Normal posture. Skin:  Intact without significant lesions or rashes. Neurologic:   Alert and oriented x4;  grossly normal neurologically. Psych:  Alert and cooperative. Normal mood and affect. Heme/Lymph/Immune: No significant cervical adenopathy. No excessive bruising noted.    01/01/2018 8:10 AM   Disclaimer: This note was dictated with voice recognition software. Similar sounding words can inadvertently be transcribed and may not be corrected upon review.

## 2018-09-30 ENCOUNTER — Telehealth: Payer: Self-pay | Admitting: Internal Medicine

## 2018-09-30 NOTE — Telephone Encounter (Signed)
Spoke with pt. I needed his Medicaid number. Pt didn't have his medicaid card on hand. PT is aware that I will contact [employer] for a PA for his Dexilant.

## 2018-09-30 NOTE — Telephone Encounter (Signed)
Pt called back and said he would need a PA on his dexilant

## 2018-09-30 NOTE — Telephone Encounter (Signed)
Pt needs RF on his Dexilant. He hung up before I could tell him that his Pharmacy needed to fax [place] a refill request. He uses [facility]

## 2018-10-01 NOTE — Telephone Encounter (Signed)
PA was approved for Dexilant 60 mg 09/2018- 09/2019. Pt and pharmacy are aware.

## 2018-11-01 ENCOUNTER — Other Ambulatory Visit: Payer: Self-pay | Admitting: Nurse Practitioner

## 2018-11-01 DIAGNOSIS — R112 Nausea with vomiting, unspecified: Secondary | ICD-10-CM

## 2018-11-01 DIAGNOSIS — K219 Gastro-esophageal reflux disease without esophagitis: Secondary | ICD-10-CM

## 2018-11-01 DIAGNOSIS — R1013 Epigastric pain: Secondary | ICD-10-CM

## 2018-11-04 ENCOUNTER — Telehealth: Payer: Self-pay | Admitting: Gastroenterology

## 2018-11-04 DIAGNOSIS — R112 Nausea with vomiting, unspecified: Secondary | ICD-10-CM

## 2018-11-04 DIAGNOSIS — R1013 Epigastric pain: Secondary | ICD-10-CM

## 2018-11-04 DIAGNOSIS — K219 Gastro-esophageal reflux disease without esophagitis: Secondary | ICD-10-CM

## 2018-11-04 NOTE — Telephone Encounter (Signed)
[phone]  Patient needs dexilant sent to [facility]

## 2018-11-04 NOTE — Telephone Encounter (Signed)
Forwarding to refill box.  

## 2018-11-06 NOTE — Telephone Encounter (Signed)
Already completed

## 2019-09-12 ENCOUNTER — Ambulatory Visit (INDEPENDENT_AMBULATORY_CARE_PROVIDER_SITE_OTHER): Payer: Medicaid Other

## 2019-09-12 ENCOUNTER — Other Ambulatory Visit: Payer: Self-pay

## 2019-09-12 DIAGNOSIS — Z23 Encounter for immunization: Secondary | ICD-10-CM | POA: Diagnosis not present

## 2019-09-12 NOTE — Progress Notes (Signed)
    Name: Kent Collins Age: 39 y.o. Sex: male DOB: 1980-06-10 MRN: [id number]   Vaccine Information Sheet (VIS) shown to guardian to read in the office.  A copy of the VIS was offered.  Provider discussed vaccine(s).  Questions were answered.  Return in about 3 weeks (around 10/03/2019) for Second Covid vaccine.

## 2019-10-03 ENCOUNTER — Ambulatory Visit (INDEPENDENT_AMBULATORY_CARE_PROVIDER_SITE_OTHER): Payer: Medicaid Other

## 2019-10-03 ENCOUNTER — Other Ambulatory Visit: Payer: Self-pay

## 2019-10-03 DIAGNOSIS — Z23 Encounter for immunization: Secondary | ICD-10-CM

## 2019-10-03 NOTE — Progress Notes (Signed)
   Covid-19 Vaccination Clinic  Name:  Kent Collins    MRN: [id number] DOB: 1980-06-02  10/03/2019  Mr. Dancer was observed post Covid-19 immunization for 15 minutes without incident. He was provided with Vaccine Information Sheet and instruction to access the V-Safe system.   Mr. Stene was instructed to call 911 with any severe reactions post vaccine: Marland Kitchen Difficulty breathing  . Swelling of face and throat  . A fast heartbeat  . A bad rash all over body  . Dizziness and weakness   Immunizations Administered    Name Date Dose VIS Date Route   Pfizer COVID-19 Vaccine 10/03/2019 12:52 PM 0.3 mL 03/12/2018 Intramuscular   Manufacturer: [employer], [employer]   Lot: [id number]   NDC: [address]-5

## 2019-11-02 ENCOUNTER — Other Ambulatory Visit: Payer: Self-pay | Admitting: Gastroenterology

## 2019-11-02 DIAGNOSIS — K219 Gastro-esophageal reflux disease without esophagitis: Secondary | ICD-10-CM

## 2019-11-02 DIAGNOSIS — R1013 Epigastric pain: Secondary | ICD-10-CM

## 2019-11-02 DIAGNOSIS — R112 Nausea with vomiting, unspecified: Secondary | ICD-10-CM

## 2019-11-11 ENCOUNTER — Other Ambulatory Visit: Payer: Self-pay | Admitting: Gastroenterology

## 2019-11-11 DIAGNOSIS — R112 Nausea with vomiting, unspecified: Secondary | ICD-10-CM

## 2019-11-11 DIAGNOSIS — K219 Gastro-esophageal reflux disease without esophagitis: Secondary | ICD-10-CM

## 2019-11-11 DIAGNOSIS — R1013 Epigastric pain: Secondary | ICD-10-CM

## 2019-11-11 NOTE — Telephone Encounter (Signed)
It has been two years since last ov.  Limited refills provided. Needs ov.s

## 2019-11-12 ENCOUNTER — Encounter: Payer: Self-pay | Admitting: Internal Medicine

## 2020-02-13 ENCOUNTER — Other Ambulatory Visit: Payer: Self-pay | Admitting: Gastroenterology

## 2020-02-13 DIAGNOSIS — R112 Nausea with vomiting, unspecified: Secondary | ICD-10-CM

## 2020-02-13 DIAGNOSIS — R1013 Epigastric pain: Secondary | ICD-10-CM

## 2020-02-13 DIAGNOSIS — K219 Gastro-esophageal reflux disease without esophagitis: Secondary | ICD-10-CM

## 2020-03-15 ENCOUNTER — Other Ambulatory Visit: Payer: Self-pay | Admitting: Nurse Practitioner

## 2020-03-15 ENCOUNTER — Encounter: Payer: Self-pay | Admitting: Internal Medicine

## 2020-03-15 DIAGNOSIS — R112 Nausea with vomiting, unspecified: Secondary | ICD-10-CM

## 2020-03-15 DIAGNOSIS — R1013 Epigastric pain: Secondary | ICD-10-CM

## 2020-03-15 DIAGNOSIS — K219 Gastro-esophageal reflux disease without esophagitis: Secondary | ICD-10-CM

## 2020-03-15 NOTE — Telephone Encounter (Signed)
Patient has not been seen since 2019.  Please let patient know I will send in a limited 32-month refill of Dexilant 60 mg daily.  He will need an office visit for any further refills.

## 2020-03-16 NOTE — Telephone Encounter (Signed)
Noted. Spoke with pt. Pt was scheduled for 06/01/20 with Wynne Dust, NP.

## 2020-04-27 ENCOUNTER — Other Ambulatory Visit: Payer: Self-pay | Admitting: Gastroenterology

## 2020-04-27 ENCOUNTER — Telehealth: Payer: Self-pay | Admitting: *Deleted

## 2020-04-27 ENCOUNTER — Telehealth: Payer: Self-pay

## 2020-04-27 DIAGNOSIS — R112 Nausea with vomiting, unspecified: Secondary | ICD-10-CM

## 2020-04-27 DIAGNOSIS — K219 Gastro-esophageal reflux disease without esophagitis: Secondary | ICD-10-CM

## 2020-04-27 DIAGNOSIS — R1013 Epigastric pain: Secondary | ICD-10-CM

## 2020-04-27 NOTE — Telephone Encounter (Signed)
Received call from [facility] requesting new Rx for dexilant and it needs to state DAW.

## 2020-04-27 NOTE — Telephone Encounter (Signed)
Noted. Message was sent to [facility] refill box.

## 2020-04-27 NOTE — Telephone Encounter (Signed)
[facility] phoned to say pt's insurance will pay for Dexilant if Dr writes another Rx and put DAW1 on it. Medicaid will pay for name brand but no generic.

## 2020-04-28 ENCOUNTER — Encounter: Payer: Self-pay | Admitting: Gastroenterology

## 2020-04-28 ENCOUNTER — Telehealth: Payer: Self-pay

## 2020-04-28 ENCOUNTER — Other Ambulatory Visit: Payer: Self-pay | Admitting: Gastroenterology

## 2020-04-28 DIAGNOSIS — K219 Gastro-esophageal reflux disease without esophagitis: Secondary | ICD-10-CM

## 2020-04-28 DIAGNOSIS — R112 Nausea with vomiting, unspecified: Secondary | ICD-10-CM

## 2020-04-28 DIAGNOSIS — R1013 Epigastric pain: Secondary | ICD-10-CM

## 2020-04-28 MED ORDER — DEXILANT 60 MG PO CPDR: 1.0000 | DELAYED_RELEASE_CAPSULE | Freq: Every day | ORAL | 0 refills | Status: DC

## 2020-04-28 NOTE — Telephone Encounter (Signed)
Will give only one 30 day supply since he has not been seen since 2019. He must keep his 05/2020 appt.

## 2020-04-28 NOTE — Telephone Encounter (Signed)
rx done again.

## 2020-04-28 NOTE — Telephone Encounter (Signed)
noted 

## 2020-04-28 NOTE — Telephone Encounter (Signed)
I phoned to [facility] in [place] regarding pt's Rx being sent in and they keep saying they have not received it. I advised her it was showing on our end and she advised that it was the pt who screen shot where we sent it in to them. I was also advised the Internet went down this morning and that may be why they don't have it. I was advised to tell the Dr to please send in a new Rx again. I also gave a verbal as advised but they want it sent in.

## 2020-04-28 NOTE — Telephone Encounter (Signed)
I send electronic RX and I checked the DAW box. I don't know if DAW1 is something different. The only other thing we can do is give pharmacy a verbal for Dexilant 60mg  daily before breakfast. Dispense as written. #30 and ZERO refills.   Keep ov as scheduled.

## 2020-04-28 NOTE — Telephone Encounter (Signed)
The pharmacy is calling once again wanting pt's Rx switched to [id number] so his insurance will cover the cost. Pt hasn't been in the office since 10/01/2017. Please advise

## 2020-04-28 NOTE — Telephone Encounter (Signed)
Noted  

## 2020-05-20 ENCOUNTER — Other Ambulatory Visit: Payer: Self-pay | Admitting: Gastroenterology

## 2020-05-20 DIAGNOSIS — R112 Nausea with vomiting, unspecified: Secondary | ICD-10-CM

## 2020-05-20 DIAGNOSIS — R1013 Epigastric pain: Secondary | ICD-10-CM

## 2020-05-20 DIAGNOSIS — K219 Gastro-esophageal reflux disease without esophagitis: Secondary | ICD-10-CM

## 2020-06-01 ENCOUNTER — Ambulatory Visit: Payer: Medicaid Other | Admitting: Nurse Practitioner

## 2020-06-09 NOTE — Progress Notes (Deleted)
Referring Provider: Lovey Newcomer, PA Primary Care Physician:  Lovey Newcomer, PA Primary GI Physician: Dr. Jena Gauss  No chief complaint on file.   HPI:    Kent ATKERSON is a 40 y.o. male with history of abdominal pain, GERD, H. pylori positive serologies treated with tetracycline, Flagyl, Pepto-Bismol, PPI regimen presenting today for follow-up and to receive medication refills.    He was last seen in our office in September 2019.  He was on PPI twice daily.  Reported nausea and vomiting about 4 times a week with occasional hematemesis.  He has completed H. pylori treatment.  He continues with constant epigastric abdominal pain.  Protonix was changed to Dexilant, Carafate 4 times daily added, and he was scheduled for an EGD.  Would need biopsies at time of EGD to verify H. pylori eradication.   Patient was scheduled for EGD/14/2019, but no showed for his preop appointment.  Today:      Past Medical History:  Diagnosis Date  . GERD (gastroesophageal reflux disease)   . Sleep apnea    awaiting on CPAP as of 10/01/17    Past Surgical History:  Procedure Laterality Date  . NONE TO DATE     As of 10/01/17    Current Outpatient Medications  Medication Sig Dispense Refill  . acetaminophen (TYLENOL) 500 MG tablet Take 1,000 mg by mouth every 6 (six) hours as needed for moderate pain.     Marland Kitchen DEXILANT 60 MG capsule TAKE ONE CAPSULE BY MOUTH DAILY BEFORE breakfast 90 capsule 3  . sucralfate (CARAFATE) 1 GM/10ML suspension Take 10 mLs (1 g total) by mouth 4 (four) times daily as needed. (Patient not taking: Reported on 10/23/2017) 420 mL 2   No current facility-administered medications for this visit.    Allergies as of 06/10/2020  . (No Known Allergies)    Family History  Problem Relation Age of Onset  . Ulcers Mother   . Ulcers Maternal Uncle   . Colon cancer Neg Hx   . Gastric cancer Neg Hx   . Esophageal cancer Neg Hx     Social History   Socioeconomic History  .  Marital status: Single    Spouse name: Not on file  . Number of children: Not on file  . Years of education: Not on file  . Highest education level: Not on file  Occupational History  . Not on file  Tobacco Use  . Smoking status: Current Some Day Smoker    Types: Cigarettes  . Smokeless tobacco: Never Used  . Tobacco comment: Typically smokes 5 cigarettes a week.  Substance and Sexual Activity  . Alcohol use: No  . Drug use: No  . Sexual activity: Not on file  Other Topics Concern  . Not on file  Social History Narrative  . Not on file   Social Determinants of Health   Financial Resource Strain: Not on file  Food Insecurity: Not on file  Transportation Needs: Not on file  Physical Activity: Not on file  Stress: Not on file  Social Connections: Not on file    Review of Systems: Gen: Denies fever, chills, anorexia. Denies fatigue, weakness, weight loss.  CV: Denies chest pain, palpitations, syncope, peripheral edema, and claudication. Resp: Denies dyspnea at rest, cough, wheezing, coughing up blood, and pleurisy. GI: Denies vomiting blood, jaundice, and fecal incontinence.   Denies dysphagia or odynophagia. Derm: Denies rash, itching, dry skin Psych: Denies depression, anxiety, memory loss, confusion. No homicidal or  suicidal ideation.  Heme: Denies bruising, bleeding, and enlarged lymph nodes.  Physical Exam: There were no vitals taken for this visit. General:   Alert and oriented. No distress noted. Pleasant and cooperative.  Head:  Normocephalic and atraumatic. Eyes:  Conjuctiva clear without scleral icterus. Mouth:  Oral mucosa pink and moist. Good dentition. No lesions. Heart:  S1, S2 present without murmurs appreciated. Lungs:  Clear to auscultation bilaterally. No wheezes, rales, or rhonchi. No distress.  Abdomen:  +BS, soft, non-tender and non-distended. No rebound or guarding. No HSM or masses noted. Msk:  Symmetrical without gross deformities. Normal  posture. Extremities:  Without edema. Neurologic:  Alert and  oriented x4 Psych:  Alert and cooperative. Normal mood and affect.

## 2020-06-10 ENCOUNTER — Ambulatory Visit: Payer: Medicaid Other | Admitting: Gastroenterology

## 2021-05-16 ENCOUNTER — Other Ambulatory Visit: Payer: Self-pay | Admitting: Gastroenterology

## 2021-05-16 DIAGNOSIS — K219 Gastro-esophageal reflux disease without esophagitis: Secondary | ICD-10-CM

## 2021-05-16 DIAGNOSIS — R112 Nausea with vomiting, unspecified: Secondary | ICD-10-CM

## 2021-05-16 DIAGNOSIS — R1013 Epigastric pain: Secondary | ICD-10-CM

## 2021-05-16 NOTE — Telephone Encounter (Signed)
Last ov 10/01/17 with Wynne Dust, NP ?

## 2021-05-19 ENCOUNTER — Encounter: Payer: Self-pay | Admitting: Internal Medicine

## 2021-05-19 NOTE — Telephone Encounter (Signed)
Needs office visit, any APP. Refills. Last saw Minerva Areola.  ?

## 2021-11-03 ENCOUNTER — Other Ambulatory Visit: Payer: Self-pay | Admitting: Gastroenterology

## 2021-11-03 DIAGNOSIS — K219 Gastro-esophageal reflux disease without esophagitis: Secondary | ICD-10-CM

## 2021-11-03 DIAGNOSIS — R1013 Epigastric pain: Secondary | ICD-10-CM

## 2021-11-03 DIAGNOSIS — R112 Nausea with vomiting, unspecified: Secondary | ICD-10-CM

## 2021-11-11 ENCOUNTER — Other Ambulatory Visit: Payer: Self-pay | Admitting: Gastroenterology

## 2021-11-11 DIAGNOSIS — R112 Nausea with vomiting, unspecified: Secondary | ICD-10-CM

## 2021-11-11 DIAGNOSIS — R1013 Epigastric pain: Secondary | ICD-10-CM

## 2021-11-11 DIAGNOSIS — K219 Gastro-esophageal reflux disease without esophagitis: Secondary | ICD-10-CM

## 2021-11-14 NOTE — Telephone Encounter (Signed)
Patient has not been seen since 2019. Needs to get refills from PCP until he can be seen here for refills. Please arrange virtual visit for refills if patient willing to do virtual. Can be any APP, as last seen by ERic.
